# Patient Record
Sex: Male | Born: 1965 | Hispanic: No | Smoking: Never smoker
Health system: Southern US, Community
[De-identification: ages and names within clinical notes are randomized; demographics above are authoritative.]

## PROBLEM LIST (undated history)

## (undated) DIAGNOSIS — E119 Type 2 diabetes mellitus without complications: Secondary | ICD-10-CM

## (undated) DIAGNOSIS — I10 Essential (primary) hypertension: Secondary | ICD-10-CM

---

## 2018-10-18 ENCOUNTER — Encounter (HOSPITAL_COMMUNITY): Payer: Self-pay | Admitting: Emergency Medicine

## 2018-10-18 ENCOUNTER — Other Ambulatory Visit: Payer: Self-pay

## 2018-10-18 ENCOUNTER — Emergency Department (HOSPITAL_COMMUNITY)
Admission: EM | Admit: 2018-10-18 | Discharge: 2018-10-18 | Disposition: A | Discharge status: Home / Self Care | Payer: Self-pay | Attending: Emergency Medicine | Admitting: Emergency Medicine

## 2018-10-18 DIAGNOSIS — Z7984 Long term (current) use of oral hypoglycemic drugs: Secondary | ICD-10-CM | POA: Insufficient documentation

## 2018-10-18 DIAGNOSIS — E119 Type 2 diabetes mellitus without complications: Secondary | ICD-10-CM | POA: Insufficient documentation

## 2018-10-18 DIAGNOSIS — M542 Cervicalgia: Secondary | ICD-10-CM | POA: Insufficient documentation

## 2018-10-18 DIAGNOSIS — I10 Essential (primary) hypertension: Secondary | ICD-10-CM | POA: Insufficient documentation

## 2018-10-18 DIAGNOSIS — R51 Headache: Secondary | ICD-10-CM | POA: Insufficient documentation

## 2018-10-18 DIAGNOSIS — R519 Headache, unspecified: Secondary | ICD-10-CM

## 2018-10-18 DIAGNOSIS — Z79899 Other long term (current) drug therapy: Secondary | ICD-10-CM | POA: Insufficient documentation

## 2018-10-18 HISTORY — DX: Essential (primary) hypertension: I10

## 2018-10-18 HISTORY — DX: Type 2 diabetes mellitus without complications: E11.9

## 2018-10-18 MED ORDER — METHOCARBAMOL 500 MG PO TABS: 500.0000 mg | ORAL_TABLET | Freq: Three times a day (TID) | ORAL | 0 refills | Status: AC

## 2018-10-18 NOTE — Discharge Instructions (Addendum)
Your blood pressure has improved significantly.  Your neurologic examination is within normal limits.  You have muscle tightness and tension in your neck and shoulder areas.  Please use Robaxin 3 times daily.  Use 2 extra strength Tylenol every 4 hours.  Robaxin may cause drowsiness.  Please do not drive a vehicle, operate machinery, or participate in activities requiring concentration when taking this medication.  Please have your glasses rechecked when possible.  Return to the emergency department if any worsening of your symptoms, changes in your condition, problems, or concerns.

## 2018-10-18 NOTE — ED Provider Notes (Signed)
[facility] EMERGENCY DEPARTMENT Provider Note   CSN: [id number] Arrival date & time: 10/18/18  1428    History   Chief Complaint Chief Complaint  Patient presents with  . Headache    HPI Norman Mack is a 53 y.o. male.     Patient is a 53 year old male who presents to the emergency department with a complaint of headache.  The patient states that this is been going on now for about 3 days.  He states it is also of note that he has been fasting over the last few days.  He says he continues to take his medication, lisinopril, for his blood pressure.  He also continues to take his medication for his diabetes.  There is been no recent injury or trauma to the head.  No fever or chills reported.  The patient reports that he has not had his glasses changed recently.  He says that he has had a problem similar to this in the past when it was a necessity for him to have the prescription for his glasses changed.  The patient denies any loss of control of upper or lower extremities.  No loss of bowel or bladder function.  No changes in his balance or focus.  No changes in his speech.  He presents now for assistance with this issue.  The history is provided by the patient.  Headache  Associated symptoms: photophobia   Associated symptoms: no abdominal pain, no back pain, no cough, no dizziness, no fever, no neck pain, no numbness, no seizures and no weakness     Past Medical History:  Diagnosis Date  . Diabetes mellitus without complication (HCC)   . Hypertension     There are no active problems to display for this patient.   History reviewed. No pertinent surgical history.      Home Medications    Prior to Admission medications   Not on File    Family History No family history on file.  Social History Social History   Tobacco Use  . Smoking status: Never Smoker  . Smokeless tobacco: Never Used  Substance Use Topics  . Alcohol use: Never    Frequency: Never  . Drug  use: Never     Allergies   Patient has no allergy information on record.   Review of Systems Review of Systems  Constitutional: Negative for activity change, appetite change, chills and fever.       All ROS Neg except as noted in HPI  HENT: Negative for nosebleeds.   Eyes: Positive for photophobia. Negative for discharge.  Respiratory: Negative for cough, shortness of breath and wheezing.   Cardiovascular: Negative for chest pain and palpitations.  Gastrointestinal: Negative for abdominal pain and blood in stool.  Genitourinary: Negative for dysuria, frequency and hematuria.  Musculoskeletal: Negative for arthralgias, back pain and neck pain.  Skin: Negative.   Neurological: Positive for headaches. Negative for dizziness, seizures, syncope, speech difficulty, weakness and numbness.  Psychiatric/Behavioral: Negative for confusion and hallucinations.     Physical Exam Updated Vital Signs BP (!) 174/106   Pulse 72   Temp 98.4 F (36.9 C)   Resp 18   Ht  (1.702 m)   Wt 87 kg   SpO2 98%   BMI 30.04 kg/m   Physical Exam Vitals signs and nursing note reviewed.  Constitutional:      General: He is not in acute distress.    Appearance: He is well-developed.  HENT:  Head: Normocephalic and atraumatic.     Right Ear: External ear normal.     Left Ear: External ear normal.  Eyes:     General: No scleral icterus.       Right eye: No discharge.        Left eye: No discharge.     Conjunctiva/sclera: Conjunctivae normal.  Neck:     Musculoskeletal: Neck supple.     Trachea: No tracheal deviation.     Comments: No carotid bruits appreciated. Cardiovascular:     Rate and Rhythm: Normal rate and regular rhythm.  Pulmonary:     Effort: Pulmonary effort is normal. No respiratory distress.     Breath sounds: Normal breath sounds. No stridor. No wheezing or rales.  Abdominal:     General: Bowel sounds are normal. There is no distension.     Palpations: Abdomen is soft.      Tenderness: There is no abdominal tenderness. There is no guarding or rebound.  Musculoskeletal:        General: No tenderness.     Comments: There is tightness and tenseness in the upper trapezius area extending into the neck. There is no palpable step-off of the cervical spine.  There are no hot areas appreciated.  Skin:    General: Skin is warm and dry.     Findings: No rash.  Neurological:     Mental Status: He is alert.     Cranial Nerves: No cranial nerve deficit (no facial droop, extraocular movements intact, no slurred speech).     Sensory: No sensory deficit.     Motor: No abnormal muscle tone or seizure activity.     Coordination: Coordination normal.     Comments: Gait is steady.  No difficulty with balance.      ED Treatments / Results  Labs (all labs ordered are listed, but only abnormal results are displayed) Labs Reviewed - No data to display  EKG None  Radiology No results found.  Procedures Procedures (including critical care time)  Medications Ordered in ED Medications - No data to display   Initial Impression / Assessment and Plan / ED Course  I have reviewed the triage vital signs and the nursing notes.  Pertinent labs & imaging results that were available during my care of the patient were reviewed by me and considered in my medical decision making (see chart for details).          Final Clinical Impressions(s) / ED Diagnoses MDM  Blood pressure is elevated initially at 174/106.  Patient states that he has been fasting over the last few days.  He complains of tightness and tenseness in his shoulder areas extending into the scalp area.  The patient also complains of some light sensitivity over the last few days.  After being in the emergency department the blood pressure seems to be improving with the pressure ranging from 144/99-150 1/97.  The pulse oximetry is 98 to 100% on room air.  No gross neurologic deficits appreciated.  Patient  is ambulatory without problem.  Patient will be treated with Robaxin for assistance with the muscle tightness and tension in the upper trapezius area extending into the neck.  I have asked the patient to continue his current medications.  The patient will use Tylenol extra strength every 4 hours for pain and discomfort as well.  Patient is given strict instructions to return immediately if any changes in his condition, worsening of his symptoms, problems, or concerns.  The patient  is in agreement with this plan.   Final diagnoses:  Bad headache    ED Discharge Orders         Ordered    methocarbamol (ROBAXIN) 500 MG tablet  3 times daily     10/18/18 1539           Ivery QualeBryant, Labella Zahradnik, PA-C 10/18/18 1549    Raeford RazorKohut, Stephen, MD 10/19/18 1053

## 2018-10-18 NOTE — ED Triage Notes (Signed)
Pt c/o of headache with neck pain and light sensitivity x 3 days

## 2018-10-19 ENCOUNTER — Encounter (HOSPITAL_COMMUNITY): Payer: Self-pay | Admitting: Emergency Medicine

## 2018-10-19 ENCOUNTER — Emergency Department (HOSPITAL_COMMUNITY): Payer: Self-pay

## 2018-10-19 ENCOUNTER — Other Ambulatory Visit: Payer: Self-pay

## 2018-10-19 ENCOUNTER — Emergency Department (HOSPITAL_COMMUNITY)
Admission: EM | Admit: 2018-10-19 | Discharge: 2018-10-19 | Disposition: A | Discharge status: Home / Self Care | Payer: Self-pay | Attending: Emergency Medicine | Admitting: Emergency Medicine

## 2018-10-19 DIAGNOSIS — I10 Essential (primary) hypertension: Secondary | ICD-10-CM | POA: Insufficient documentation

## 2018-10-19 DIAGNOSIS — Z79899 Other long term (current) drug therapy: Secondary | ICD-10-CM | POA: Insufficient documentation

## 2018-10-19 DIAGNOSIS — Z7984 Long term (current) use of oral hypoglycemic drugs: Secondary | ICD-10-CM | POA: Insufficient documentation

## 2018-10-19 DIAGNOSIS — E119 Type 2 diabetes mellitus without complications: Secondary | ICD-10-CM | POA: Insufficient documentation

## 2018-10-19 LAB — CBC WITH DIFFERENTIAL/PLATELET
Abs Immature Granulocytes: 0.01 10*3/uL (ref 0.00–0.07)
Basophils Absolute: 0 10*3/uL (ref 0.0–0.1)
Basophils Relative: 1 %
Eosinophils Absolute: 0.2 10*3/uL (ref 0.0–0.5)
Eosinophils Relative: 3 %
HCT: 46.2 % (ref 39.0–52.0)
Hemoglobin: 15 g/dL (ref 13.0–17.0)
Immature Granulocytes: 0 %
Lymphocytes Relative: 36 %
Lymphs Abs: 2.1 10*3/uL (ref 0.7–4.0)
MCH: 27.3 pg (ref 26.0–34.0)
MCHC: 32.5 g/dL (ref 30.0–36.0)
MCV: 84 fL (ref 80.0–100.0)
Monocytes Absolute: 0.4 10*3/uL (ref 0.1–1.0)
Monocytes Relative: 7 %
Neutro Abs: 3.1 10*3/uL (ref 1.7–7.7)
Neutrophils Relative %: 53 %
Platelets: 215 10*3/uL (ref 150–400)
RBC: 5.5 MIL/uL (ref 4.22–5.81)
RDW: 14.4 % (ref 11.5–15.5)
WBC: 5.8 10*3/uL (ref 4.0–10.5)
nRBC: 0 % (ref 0.0–0.2)

## 2018-10-19 LAB — COMPREHENSIVE METABOLIC PANEL
ALT: 23 U/L (ref 0–44)
AST: 13 U/L — ABNORMAL LOW (ref 15–41)
Albumin: 4.2 g/dL (ref 3.5–5.0)
Alkaline Phosphatase: 44 U/L (ref 38–126)
Anion gap: 9 (ref 5–15)
BUN: 21 mg/dL — ABNORMAL HIGH (ref 6–20)
CO2: 24 mmol/L (ref 22–32)
Calcium: 9.1 mg/dL (ref 8.9–10.3)
Chloride: 103 mmol/L (ref 98–111)
Creatinine, Ser: 0.92 mg/dL (ref 0.61–1.24)
GFR calc Af Amer: >60 mL/min (ref >60)
GFR calc non Af Amer: >60 mL/min (ref >60)
Glucose, Bld: 183 mg/dL — ABNORMAL HIGH (ref 70–99)
Potassium: 4.3 mmol/L (ref 3.5–5.1)
Sodium: 136 mmol/L (ref 135–145)
Total Bilirubin: 0.3 mg/dL (ref 0.3–1.2)
Total Protein: 7.4 g/dL (ref 6.5–8.1)

## 2018-10-19 LAB — TSH: TSH: 1.648 u[IU]/mL (ref 0.350–4.500)

## 2018-10-19 LAB — TROPONIN I: Troponin I: <0.03 ng/mL (ref <0.03)

## 2018-10-19 MED ORDER — LISINOPRIL 20 MG PO TABS: 20.0000 mg | ORAL_TABLET | Freq: Every day | ORAL | 0 refills | Status: AC

## 2018-10-19 NOTE — ED Provider Notes (Signed)
[facility] EMERGENCY DEPARTMENT Provider Note   CSN: [id number] Arrival date & time: 10/19/18  1253    History   Chief Complaint Chief Complaint  Patient presents with  . Headache    HPI Norman Mack is a 53 y.o. male.     HPI Patient presents with few different complaints.  Seen yesterday for headache that was thought to be musculoskeletal.  Patient states for the last 3 to 4 days he has been having headaches chest pain weakness dizziness and feels his heart go slow.  States it is worse when he stands up.  Does not happen immediately when he stands up on his or when he sits but states that it happened around 30 minutes later.  States he will get nauseous.  Some chest pain at times with it.  No fevers or chills.  No weight loss.  Has not had pains like this before.  No trauma.  States he has a dull headache that seems to come along with his episodes also.  Denies fever. Past Medical History:  Diagnosis Date  . Diabetes mellitus without complication (HCC)   . Hypertension     There are no active problems to display for this patient.   History reviewed. No pertinent surgical history.      Home Medications    Prior to Admission medications   Medication Sig Start Date End Date Taking? Authorizing Provider  metFORMIN (GLUCOPHAGE) 500 MG tablet Take by mouth 2 (two) times daily with a meal.   Yes [provider]  methocarbamol (ROBAXIN) 500 MG tablet Take 1 tablet (500 mg total) by mouth 3 (three) times daily. 10/18/18  Yes Ivery Quale, PA-C  lisinopril (ZESTRIL) 20 MG tablet Take 1 tablet (20 mg total) by mouth daily. 10/19/18   Benjiman Core, MD    Family History History reviewed. No pertinent family history.  Social History Social History   Tobacco Use  . Smoking status: Never Smoker  . Smokeless tobacco: Never Used  Substance Use Topics  . Alcohol use: Never    Frequency: Never  . Drug use: Never     Allergies   Patient has no allergy  information on record.   Review of Systems Review of Systems  Constitutional: Positive for fatigue. Negative for appetite change.  HENT: Negative for congestion.   Respiratory: Negative for shortness of breath.   Cardiovascular: Positive for chest pain.  Gastrointestinal: Positive for diarrhea. Negative for abdominal pain.  Genitourinary: Negative for flank pain.  Musculoskeletal: Negative for back pain.  Skin: Negative for pallor.  Neurological: Positive for light-headedness and headaches. Negative for weakness.  Psychiatric/Behavioral: Negative for confusion.     Physical Exam Updated Vital Signs BP (!) 150/103   Pulse 78   Resp 19   Ht 5\' 7"  (1.702 m)   Wt 86.6 kg   SpO2 95%   BMI 29.91 kg/m   Physical Exam Vitals signs and nursing note reviewed.  Eyes:     Pupils: Pupils are equal, round, and reactive to light.  Neck:     Musculoskeletal: Neck supple.  Cardiovascular:     Rate and Rhythm: Normal rate and regular rhythm.  Pulmonary:     Effort: Pulmonary effort is normal.  Abdominal:     Tenderness: There is no abdominal tenderness.  Musculoskeletal:        General: No swelling.  Skin:    General: Skin is warm.  Neurological:     Mental Status: He is alert.  ED Treatments / Results  Labs (all labs ordered are listed, but only abnormal results are displayed) Labs Reviewed  COMPREHENSIVE METABOLIC PANEL - Abnormal; Notable for the following components:      Result Value   Glucose, Bld 183 (*)    BUN 21 (*)    AST 13 (*)    All other components within normal limits  TROPONIN I  CBC WITH DIFFERENTIAL/PLATELET  TSH    EKG EKG Interpretation  Date/Time:  Thursday October 19 2018 13:54:58 EDT Ventricular Rate:  73 PR Interval:    QRS Duration: 99 QT Interval:  363 QTC Calculation: 400 R Axis:   47 Text Interpretation:  Sinus rhythm Confirmed by Benjiman CorePickering, Kennidi Yoshida [phone]([id number]) on 10/19/2018 2:00:51 PM   Radiology Dg Chest 2 View  Result Date:  10/19/2018 CLINICAL DATA:  Headache.  Joint pain, and generalized weakness. EXAM: CHEST - 2 VIEW COMPARISON:  None. FINDINGS: The heart size and mediastinal contours are within normal limits. Both lungs are clear. The visualized skeletal structures are unremarkable. IMPRESSION: No active cardiopulmonary disease. Electronically Signed   By: Elsie StainJohn T Curnes M.D.   On: 10/19/2018 14:24   Ct Head Wo Contrast  Result Date: 10/19/2018 CLINICAL DATA:  Headache, joint pain, and weakness. EXAM: CT HEAD WITHOUT CONTRAST TECHNIQUE: Contiguous axial images were obtained from the base of the skull through the vertex without intravenous contrast. COMPARISON:  None. FINDINGS: Brain: No evidence for acute infarction, hemorrhage, mass lesion, hydrocephalus, or extra-axial fluid. Mild atrophy. Hypoattenuation of white matter, likely small vessel disease. Vascular: No hyperdense vessel or unexpected calcification. Skull: Normal. Negative for fracture or focal lesion. Sinuses/Orbits: No acute finding. Other: None. IMPRESSION: Mild atrophy and small vessel disease. No acute intracranial findings. Electronically Signed   By: Elsie StainJohn T Curnes M.D.   On: 10/19/2018 14:43    Procedures Procedures (including critical care time)  Medications Ordered in ED Medications - No data to display   Initial Impression / Assessment and Plan / ED Course  I have reviewed the triage vital signs and the nursing notes.  Pertinent labs & imaging results that were available during my care of the patient were reviewed by me and considered in my medical decision making (see chart for details).        Patient presents with several complaints.  Weakness headaches lightheadedness.  Work-up overall reassuring.  Mild hyperglycemia but is hypertensive.  He is only on 10 mg of lisinopril.  Will increase this to 20.  Will need to follow-up with PCP.  Final Clinical Impressions(s) / ED Diagnoses   Final diagnoses:  Hypertension, unspecified type     ED Discharge Orders         Ordered    lisinopril (ZESTRIL) 20 MG tablet  Daily     10/19/18 1528           Benjiman CorePickering, Zali Kamaka, MD 10/19/18 2012

## 2018-10-19 NOTE — Discharge Instructions (Addendum)
Increase your blood pressure medicine.  For now you can take 2 of your 10 mg lisinopril tabs.  Follow-up with your primary care doctor for further working up of the high blood pressure and feeling bad.

## 2018-10-19 NOTE — ED Triage Notes (Signed)
Pt c/o of headache, joint pain and generalized weakness x 3 days. Was seen yesterday for same thing but no improvement

## 2020-06-11 IMAGING — DX CHEST - 2 VIEW
2 series · 2 of 2 positions shown · non-contrast
Comparison: None.

CLINICAL DATA: Headache.  Joint pain, and generalized weakness.

EXAM:
CHEST - 2 VIEW

[chest pa]
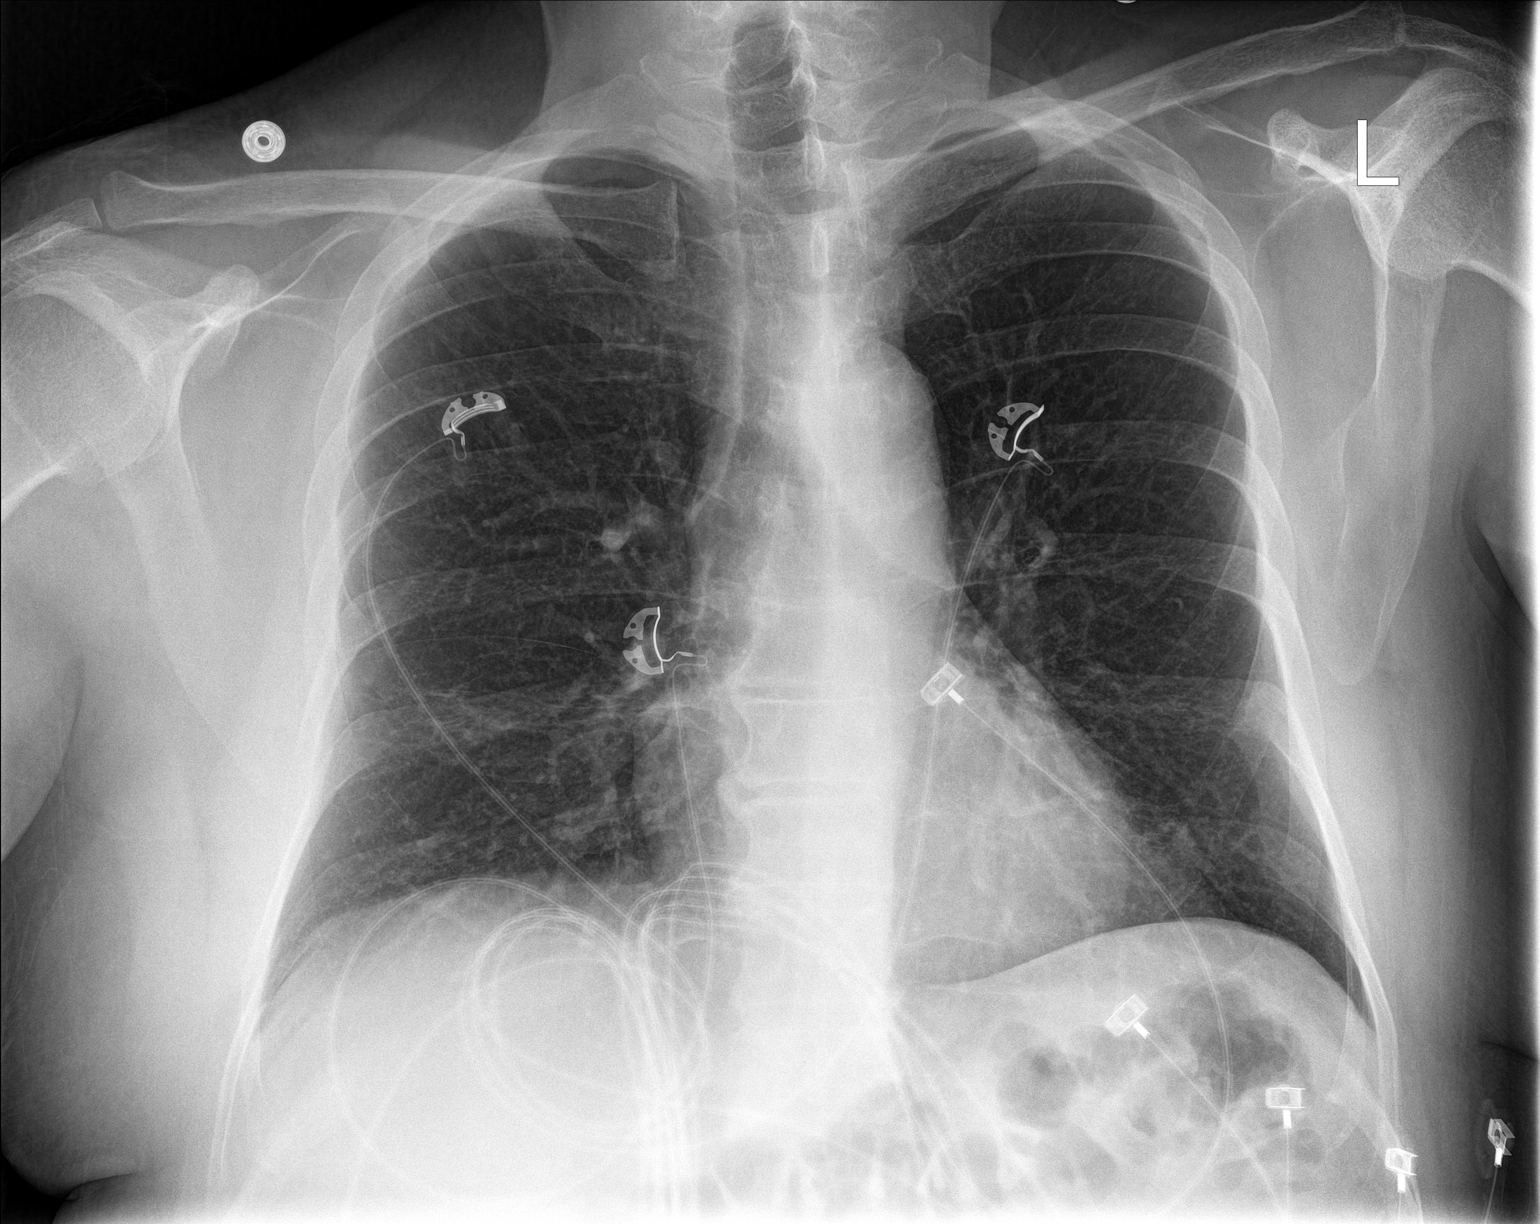

[chest lat]
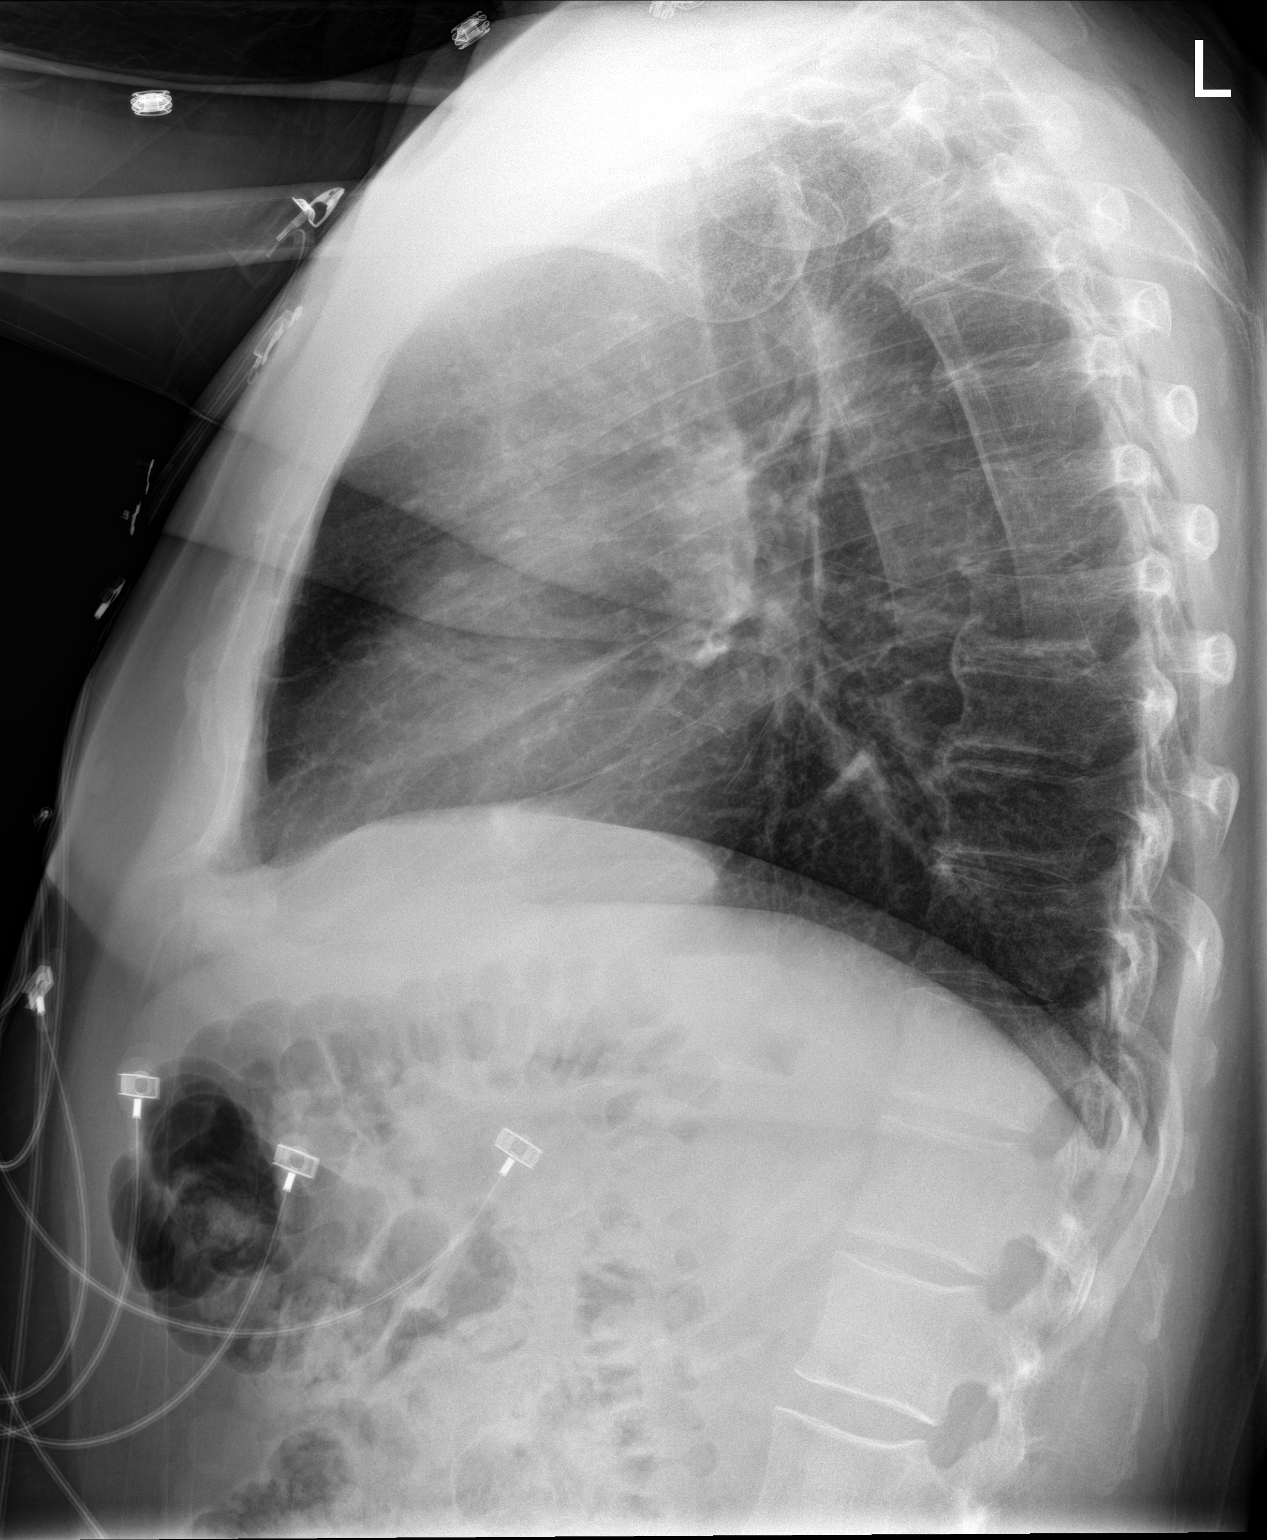

[2 of 2 positions shown; findings below may reference images not displayed]

FINDINGS: The heart size and mediastinal contours are within normal limits.
Both lungs are clear. The visualized skeletal structures are
unremarkable.
IMPRESSION: No active cardiopulmonary disease.

## 2020-06-11 IMAGING — CT CT HEAD WITHOUT CONTRAST
3 series · 16 of 47 positions shown, 19 images · non-contrast
Comparison: None.

CLINICAL DATA: Headache, joint pain, and weakness.

EXAM:
CT HEAD WITHOUT CONTRAST
TECHNIQUE: Contiguous axial images were obtained from the base of the skull
through the vertex without intravenous contrast.

[Series 2: head w o · axial · 0.44mm/px · z∈[+1412,+1552]mm · 10 of 34 slices shown, 13 images]
[im 3/34  brain]
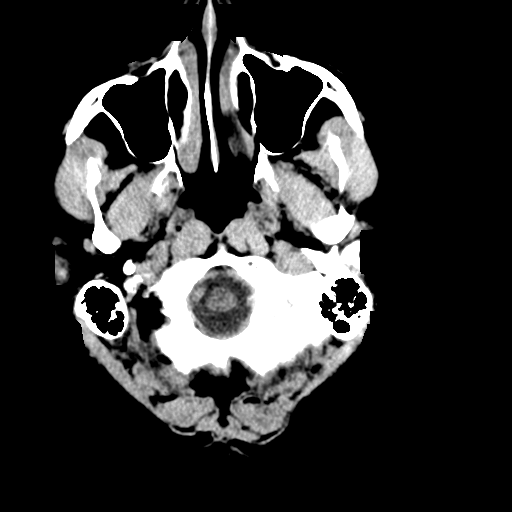
[im 3/34  bone]
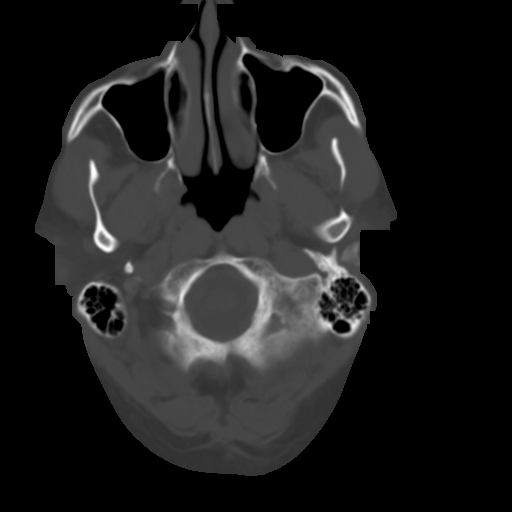
[im 6/34  brain]
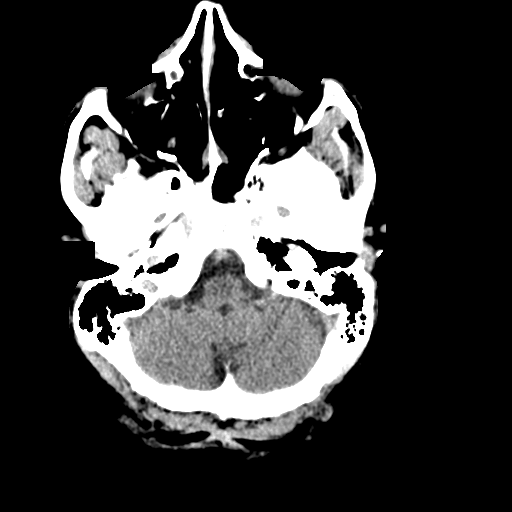
[im 10/34  brain]
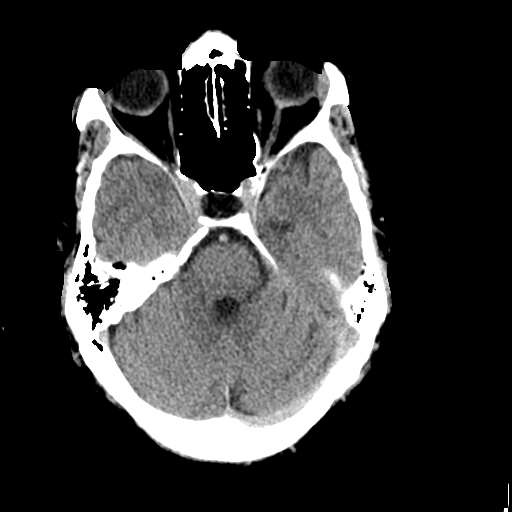
[im 12/34  brain]
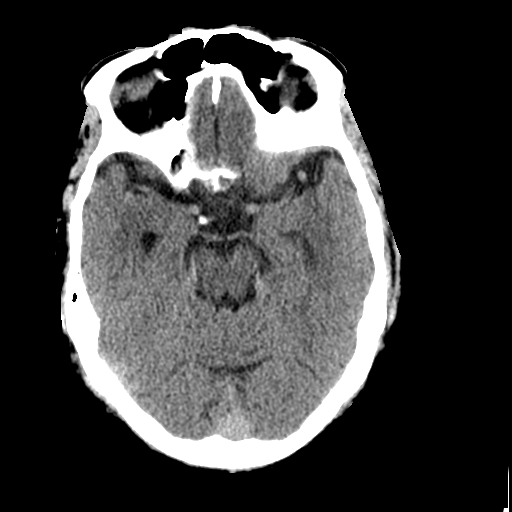
[im 15/34  brain]
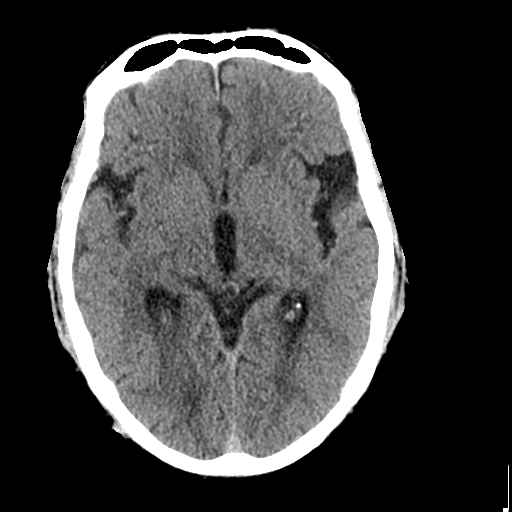
[im 15/34  bone]
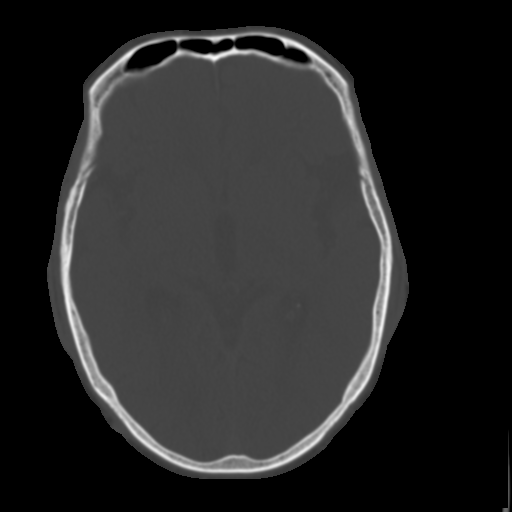
[im 19/34  brain]
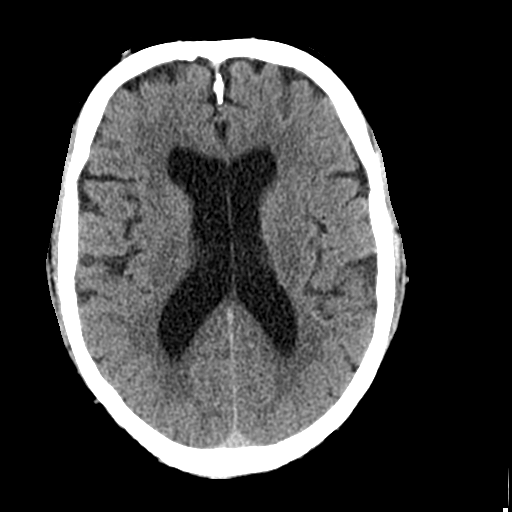
[im 22/34  brain]
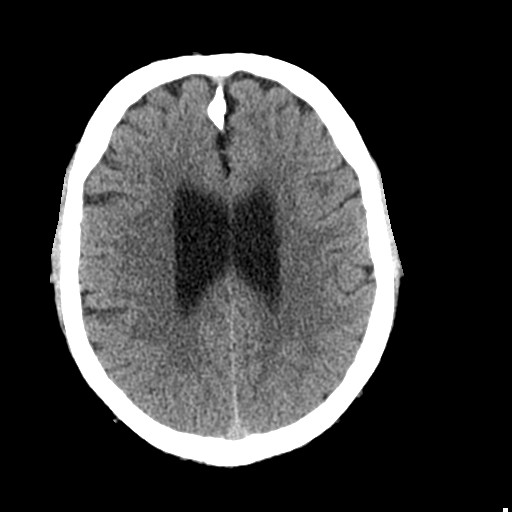
[im 26/34  brain]
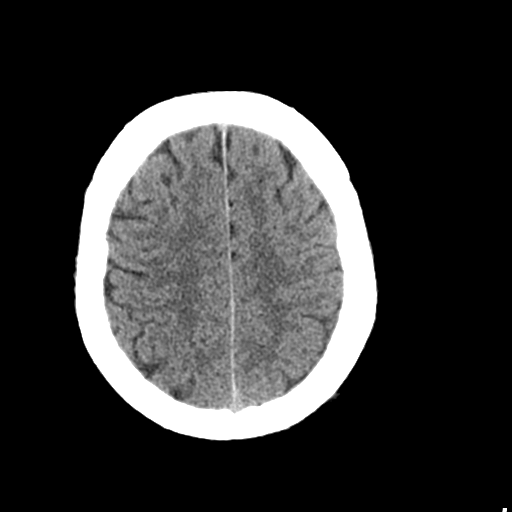
[im 28/34  brain]
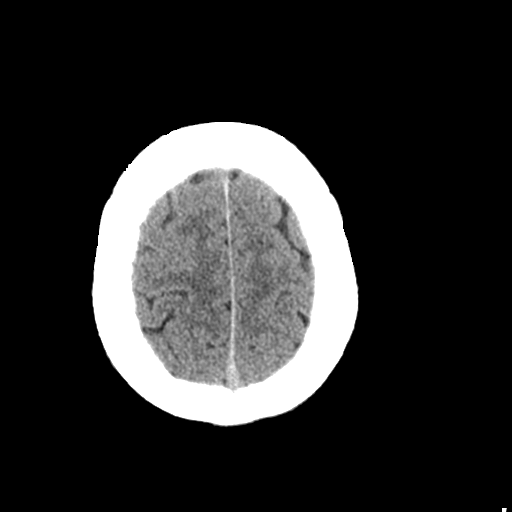
[im 28/34  bone]
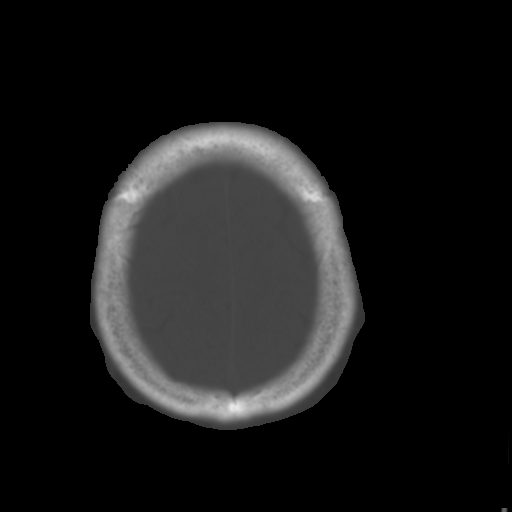
[im 31/34  brain]
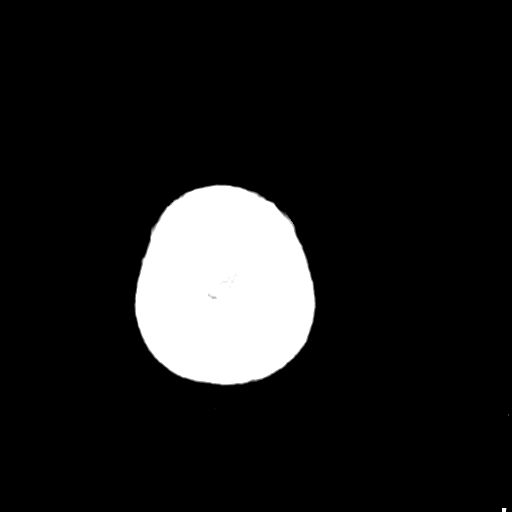

[Series 4: coronal soft · coronal · 0.34mm/px · 3 of 83 slices shown]
[im 28/83  brain]
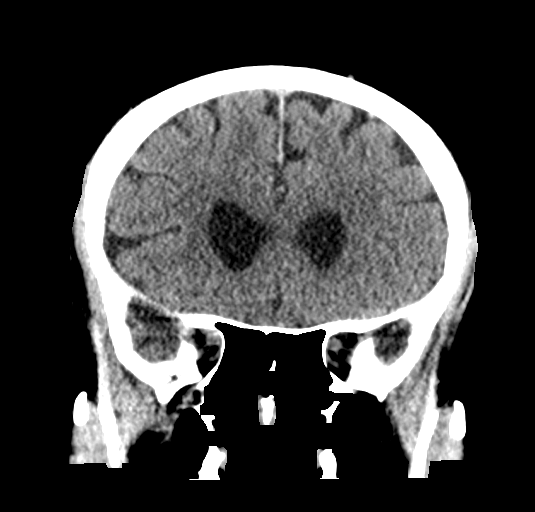
[im 37/83  brain]
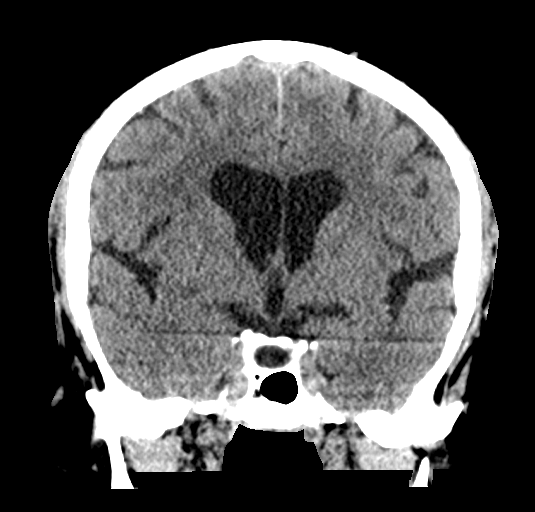
[im 46/83  brain]
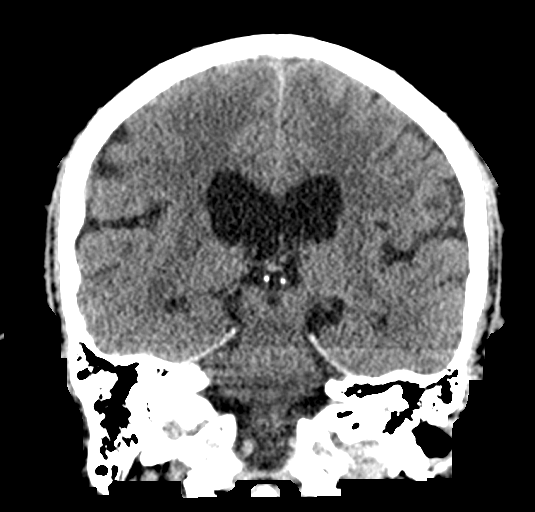

[Series 5: sagittal soft · sagittal · 0.37mm/px · 3 of 65 slices shown]
[im 22/65  brain]
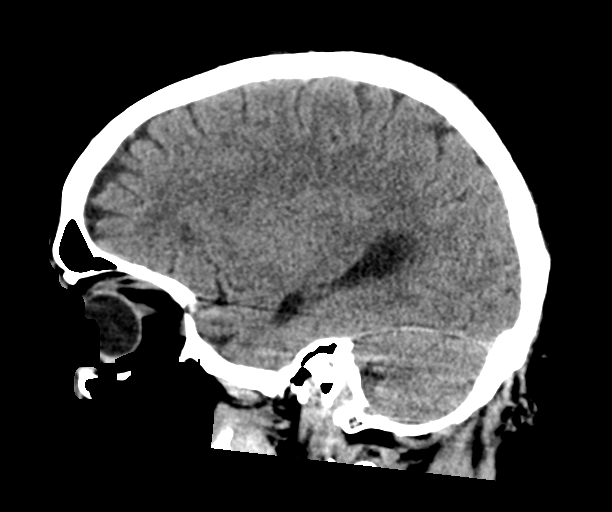
[im 33/65  brain]
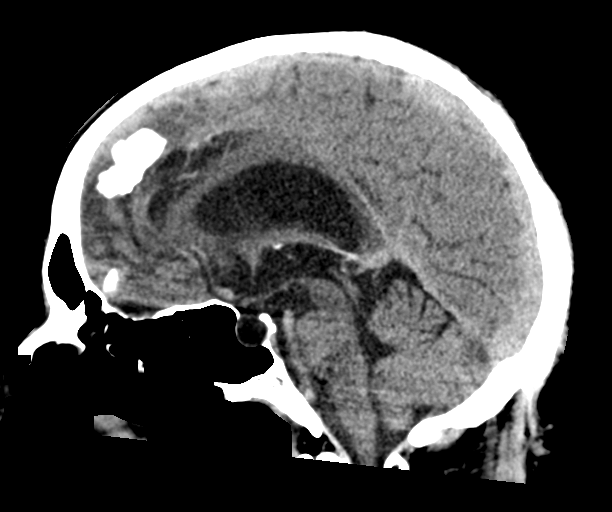
[im 43/65  brain]
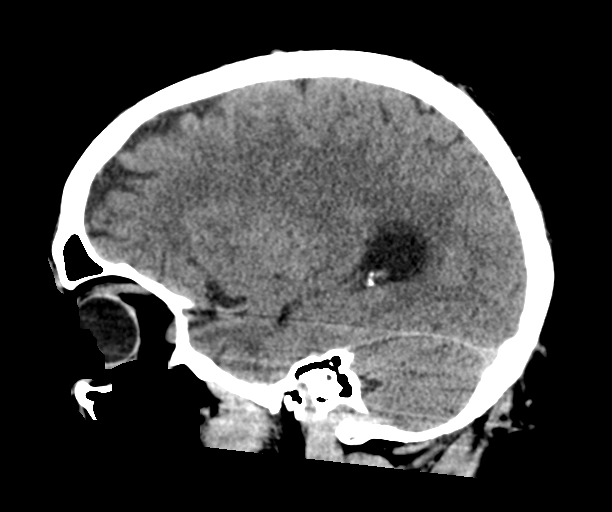

[16 of 47 positions shown; findings below may reference images not displayed]

FINDINGS: Brain: No evidence for acute infarction, hemorrhage, mass lesion,
hydrocephalus, or extra-axial fluid. Mild atrophy. Hypoattenuation
of white matter, likely small vessel disease.

Vascular: No hyperdense vessel or unexpected calcification.

Skull: Normal. Negative for fracture or focal lesion.

Sinuses/Orbits: No acute finding.

Other: None.
IMPRESSION: Mild atrophy and small vessel disease. No acute intracranial
findings.
